# Patient Record
Sex: Female | Born: 1995 | Race: White | Hispanic: No | Marital: Single | Smoking: Never smoker
Health system: Southern US, Community
[De-identification: ages and names within clinical notes are randomized; demographics above are authoritative.]

## PROBLEM LIST (undated history)

## (undated) HISTORY — PX: NO PAST SURGERIES: SHX2092

---
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
## 2013-11-06 ENCOUNTER — Emergency Department (HOSPITAL_COMMUNITY): Payer: Self-pay

## 2013-11-06 ENCOUNTER — Emergency Department (HOSPITAL_COMMUNITY)
Admission: EM | Admit: 2013-11-06 | Discharge: 2013-11-06 | Disposition: A | Discharge status: Home / Self Care | Payer: Self-pay | Attending: Emergency Medicine | Admitting: Emergency Medicine

## 2013-11-06 ENCOUNTER — Encounter (HOSPITAL_COMMUNITY): Payer: Self-pay | Admitting: Emergency Medicine

## 2013-11-06 DIAGNOSIS — R05 Cough: Secondary | ICD-10-CM

## 2013-11-06 DIAGNOSIS — R059 Cough, unspecified: Secondary | ICD-10-CM

## 2013-11-06 DIAGNOSIS — Z880 Allergy status to penicillin: Secondary | ICD-10-CM | POA: Insufficient documentation

## 2013-11-06 DIAGNOSIS — J40 Bronchitis, not specified as acute or chronic: Secondary | ICD-10-CM | POA: Insufficient documentation

## 2013-11-06 MED ORDER — HYDROCODONE-HOMATROPINE 5-1.5 MG/5ML PO SYRP: 5.0000 mL | ORAL_SOLUTION | Freq: Four times a day (QID) | ORAL | Status: DC | PRN

## 2013-11-06 MED ORDER — ALBUTEROL SULFATE HFA 108 (90 BASE) MCG/ACT IN AERS
2.0000 | INHALATION_SPRAY | Freq: Once | RESPIRATORY_TRACT | Status: AC
  Administered 2013-11-06: 2 via RESPIRATORY_TRACT
  Filled 2013-11-06: qty 6.7

## 2013-11-06 MED ORDER — HYDROCODONE-HOMATROPINE 5-1.5 MG/5ML PO SYRP
5.0000 mL | ORAL_SOLUTION | Freq: Once | ORAL | Status: AC
  Administered 2013-11-06: 5 mL via ORAL
  Filled 2013-11-06: qty 5

## 2013-11-06 MED ORDER — PREDNISONE 20 MG PO TABS: 40.0000 mg | ORAL_TABLET | Freq: Every day | ORAL | Status: DC

## 2013-11-06 NOTE — ED Notes (Signed)
Pt complains of a cough for one month and being short of breath, tonight it woke her up and she vomited

## 2013-11-06 NOTE — ED Provider Notes (Signed)
CSN: [id number]     Arrival date & time 11/06/13  0027 History   First MD Initiated Contact with Patient 11/06/13 0105     Chief Complaint  Patient presents with  . Cough    (Consider location/radiation/quality/duration/timing/severity/associated sxs/prior Treatment) Patient is a 18 y.o. female presenting with cough. The history is provided by the patient. No language interpreter was used.  Cough Cough characteristics:  Dry (intermittently productive of clear phlegm, occassional  streaks of bright red blood) Severity:  Moderate Onset quality:  Gradual Duration:  6 weeks Timing:  Intermittent Progression:  Waxing and waning Chronicity:  New Smoker: no   Context: not sick contacts and not upper respiratory infection   Relieved by:  Nothing Worsened by:  Deep breathing Ineffective treatments: OTC remedies. Associated symptoms: sore throat   Associated symptoms: no chest pain, no fever, no rash, no rhinorrhea, no shortness of breath, no sinus congestion and no wheezing   Risk factors: no recent infection and no recent travel     History reviewed. No pertinent past medical history. History reviewed. No pertinent past surgical history. History reviewed. No pertinent family history. History  Substance Use Topics  . Smoking status: Never Smoker   . Smokeless tobacco: Not on file  . Alcohol Use: No   OB History   Grav Para Term Preterm Abortions TAB SAB Ect Mult Living                  Review of Systems  Constitutional: Negative for fever.  HENT: Positive for sore throat. Negative for rhinorrhea.   Respiratory: Positive for cough and chest tightness. Negative for shortness of breath and wheezing.   Cardiovascular: Negative for chest pain.  Gastrointestinal: Negative for nausea and vomiting.  Skin: Negative for rash.  All other systems reviewed and are negative.    Allergies  Penicillins  Home Medications   Prior to Admission medications   Medication Sig Start Date  End Date Taking? Authorizing Provider  etonogestrel (IMPLANON) 68 MG IMPL implant Inject 1 each into the skin once. 08/02/13  Yes Historical Provider, MD  HYDROcodone-homatropine (HYCODAN) 5-1.5 MG/5ML syrup Take 5 mLs by mouth every 6 (six) hours as needed for cough. 11/06/13   Antony MaduraKelly Emiliana Blaize, PA-C  predniSONE (DELTASONE) 20 MG tablet Take 2 tablets (40 mg total) by mouth daily. 11/06/13   Antony MaduraKelly Aura Bibby, PA-C   BP 109/77  Pulse 79  Temp(Src) 98.3 F (36.8 C) (Oral)  Resp 18  Ht 5\' 2"  (1.575 m)  Wt 117 lb (53.071 kg)  BMI 21.39 kg/m2  SpO2 97%   Physical Exam  Nursing note and vitals reviewed. Constitutional: She is oriented to person, place, and time. She appears well-developed and well-nourished. No distress.  Nontoxic/nonseptic appearing.  HENT:  Head: Normocephalic and atraumatic.  Mouth/Throat: Oropharynx is clear and moist. No oropharyngeal exudate.  Eyes: Conjunctivae and EOM are normal. Pupils are equal, round, and reactive to light. No scleral icterus.  Neck: Normal range of motion.  Cardiovascular: Normal rate, regular rhythm and normal heart sounds.   Pulmonary/Chest: Effort normal and breath sounds normal. No respiratory distress. She has no wheezes. She has no rales.  Intermittent dry cough appreciated at bedside. No tachypnea, dyspnea, retractions, or accessory muscle use.  Abdominal: Soft. She exhibits no distension. There is no tenderness. There is no rebound.  Musculoskeletal: Normal range of motion.  Neurological: She is alert and oriented to person, place, and time.  GCS 15. Patient moves extremities without ataxia.  Skin: Skin  is warm and dry. No rash noted. She is not diaphoretic. No erythema. No pallor.  Psychiatric: She has a normal mood and affect. Her behavior is normal.    ED Course  Procedures (including critical care time) Labs Review Labs Reviewed - No data to display  Imaging Review Dg Chest 2 View  11/06/2013   CLINICAL DATA:  Cough.  EXAM: CHEST  2 VIEW   COMPARISON:  None.  FINDINGS: Normal heart size and mediastinal contours. No acute infiltrate or edema. No effusion or pneumothorax. No acute osseous findings.  IMPRESSION: No active cardiopulmonary disease.   Electronically Signed   By: Tiburcio Pea M.D.   On: 11/06/2013 01:25     EKG Interpretation None      MDM   Final diagnoses:  Cough  Bronchitis    18 year old presents for cough x 6 weeks. Symptoms consistent with bronchitis. No evidence of focal consolidation or pneumonia on chest x-ray. Doubt atypically presenting PE as patient without tachycardia, tachypnea, dyspnea, or hypoxia. Patient is well and nontoxic appearing, hemodynamically stable, and afebrile. She is stable for outpatient management with prescriptions for Hycodan, prednisone course, and albuterol inhaler. Primary care followup advised and return precautions discussed. Patient agreeable to plan with no unaddressed concerns.   Filed Vitals:   11/06/13 0034 11/06/13 0148  BP: 109/77 96/56  Pulse: 79 58  Temp: 98.3 F (36.8 C) 98.3 F (36.8 C)  TempSrc: Oral Oral  Resp: 18 16  Height: 5\' 2"  (1.575 m)   Weight: 117 lb (53.071 kg)   SpO2: 97% 96%      Antony Madura, PA-C 11/06/13 [phone]

## 2013-11-06 NOTE — ED Notes (Signed)
Patient transported to X-ray 

## 2013-11-06 NOTE — Discharge Instructions (Signed)
Take prednisone as prescribed. Use albuterol inhaler as needed for shortness of breath, 2 puffs every 4 hours. Use Hycodan as needed for severe cough. Do not drive or drink alcohol after taking this medication as it may make you drowsy. You may use over-the-counter cough remedies as needed. Followup with her primary care doctor.  Cough, Adult  A cough is a reflex that helps clear your throat and airways. It can help heal the body or may be a reaction to an irritated airway. A cough may only last 2 or 3 weeks (acute) or may last more than 8 weeks (chronic).  CAUSES Acute cough:  Viral or bacterial infections. Chronic cough:  Infections.  Allergies.  Asthma.  Post-nasal drip.  Smoking.  Heartburn or acid reflux.  Some medicines.  Chronic lung problems (COPD).  Cancer. SYMPTOMS   Cough.  Fever.  Chest pain.  Increased breathing rate.  High-pitched whistling sound when breathing (wheezing).  Colored mucus that you cough up (sputum). TREATMENT   A bacterial cough may be treated with antibiotic medicine.  A viral cough must run its course and will not respond to antibiotics.  Your caregiver may recommend other treatments if you have a chronic cough. HOME CARE INSTRUCTIONS   Only take over-the-counter or prescription medicines for pain, discomfort, or fever as directed by your caregiver. Use cough suppressants only as directed by your caregiver.  Use a cold steam vaporizer or humidifier in your bedroom or home to help loosen secretions.  Sleep in a semi-upright position if your cough is worse at night.  Rest as needed.  Stop smoking if you smoke. SEEK IMMEDIATE MEDICAL CARE IF:   You have pus in your sputum.  Your cough starts to worsen.  You cannot control your cough with suppressants and are losing sleep.  You begin coughing up blood.  You have difficulty breathing.  You develop pain which is getting worse or is uncontrolled with medicine.  You have  a fever. MAKE SURE YOU:   Understand these instructions.  Will watch your condition.  Will get help right away if you are not doing well or get worse. Document Released: 11/21/2010 Document Revised: 08/17/2011 Document Reviewed: 11/21/2010 [facility] Patient Information 2014 [place], [place].

## 2013-11-08 NOTE — ED Provider Notes (Signed)
I personally performed the services described in this documentation, which was scribed in my presence. The recorded information has been reviewed and is accurate.  Derwood Kaplan, MD 11/08/13 [phone]

## 2014-01-19 ENCOUNTER — Encounter (HOSPITAL_COMMUNITY): Payer: Self-pay | Admitting: Emergency Medicine

## 2014-01-19 ENCOUNTER — Emergency Department (HOSPITAL_COMMUNITY)
Admission: EM | Admit: 2014-01-19 | Discharge: 2014-01-19 | Disposition: A | Discharge status: Home / Self Care | Payer: Self-pay | Attending: Emergency Medicine | Admitting: Emergency Medicine

## 2014-01-19 DIAGNOSIS — L299 Pruritus, unspecified: Secondary | ICD-10-CM | POA: Insufficient documentation

## 2014-01-19 DIAGNOSIS — Z880 Allergy status to penicillin: Secondary | ICD-10-CM | POA: Insufficient documentation

## 2014-01-19 DIAGNOSIS — R21 Rash and other nonspecific skin eruption: Secondary | ICD-10-CM | POA: Insufficient documentation

## 2014-01-19 MED ORDER — DIPHENHYDRAMINE HCL 25 MG PO TABS: 25.0000 mg | ORAL_TABLET | Freq: Every evening | ORAL | Status: DC | PRN

## 2014-01-19 MED ORDER — LORATADINE 10 MG PO TABS: 10.0000 mg | ORAL_TABLET | Freq: Every day | ORAL | Status: DC

## 2014-01-19 MED ORDER — HYDROCORTISONE 1 % EX CREA: 1.0000 "application " | TOPICAL_CREAM | Freq: Two times a day (BID) | CUTANEOUS | Status: DC

## 2014-01-19 MED ORDER — PERMETHRIN 5 % EX CREA: TOPICAL_CREAM | CUTANEOUS | Status: DC

## 2014-01-19 NOTE — ED Notes (Signed)
Pt states she has a rash on the back of her neck that radiates up into her hair line. Pt states rash started 3 weeks ago. Now rash is on upper arms. Pt states rash is very itchy. Pt has used benadryl, but stopped using it because it makes her very sleepy. Pt has no acute distress.

## 2014-01-19 NOTE — ED Provider Notes (Signed)
CSN: [id number]     Arrival date & time 01/19/14  1701 History   First MD Initiated Contact with Patient 01/19/14 1726     Chief Complaint  Patient presents with  . Rash     (Consider location/radiation/quality/duration/timing/severity/associated sxs/prior Treatment) HPI Comments: Patient a 18 year old female presenting to the emergency department for 3 weeks. Neck rash he can that extended into her scalp and onto her right arm. Patient states she had a similar rash in the past. Denies any new exposures or insect bites. Patient states she has tried Benadryl, but makes her too sleepy so she stopped taking it. Denies any fevers, chills, nausea, vomiting, abdominal pain, SOB, lip or tongue swelling.   Patient is a 18 y.o. female presenting with rash.  Rash   History reviewed. No pertinent past medical history. History reviewed. No pertinent past surgical history. No family history on file. History  Substance Use Topics  . Smoking status: Never Smoker   . Smokeless tobacco: Not on file  . Alcohol Use: No   OB History   Grav Para Term Preterm Abortions TAB SAB Ect Mult Living                 Review of Systems  Skin: Positive for rash.  All other systems reviewed and are negative.     Allergies  Penicillins  Home Medications   Prior to Admission medications   Medication Sig Start Date End Date Taking? Authorizing Provider  diphenhydrAMINE (BENADRYL) 25 MG tablet Take 1 tablet (25 mg total) by mouth at bedtime as needed for itching (Rash). 01/19/14   Jarrius Huaracha L Caitlynn Ju, PA-C  etonogestrel (IMPLANON) 68 MG IMPL implant Inject 1 each into the skin once. 08/02/13   Historical Provider, MD  HYDROcodone-homatropine (HYCODAN) 5-1.5 MG/5ML syrup Take 5 mLs by mouth every 6 (six) hours as needed for cough. 11/06/13   Antony MaduraKelly Humes, PA-C  hydrocortisone cream 1 % Apply 1 application topically 2 (two) times daily. 01/19/14   Ashara Lounsbury L Jaqwan Wieber, PA-C  loratadine (CLARITIN) 10 MG tablet  Take 1 tablet (10 mg total) by mouth daily. 01/19/14   Donnavan Covault L Seanna Sisler, PA-C  permethrin (ELIMITE) 5 % cream Apply to affected area once 01/19/14   Victorino DikeJennifer L Nayleen Janosik, PA-C  predniSONE (DELTASONE) 20 MG tablet Take 2 tablets (40 mg total) by mouth daily. 11/06/13   Antony MaduraKelly Humes, PA-C   BP 107/63  Pulse 65  Temp(Src) 98.4 F (36.9 C) (Oral)  Resp 16  SpO2 98% Physical Exam  Nursing note and vitals reviewed. Constitutional: She is oriented to person, place, and time. She appears well-developed and well-nourished. No distress.  HENT:  Head: Normocephalic and atraumatic.  Right Ear: External ear normal.  Left Ear: External ear normal.  Nose: Nose normal.  Mouth/Throat: Oropharynx is clear and moist.  Eyes: Conjunctivae are normal.  Neck: Normal range of motion. Neck supple.  Cardiovascular: Normal rate.   Pulmonary/Chest: Effort normal.  Abdominal: Soft.  Musculoskeletal: Normal range of motion.  Neurological: She is alert and oriented to person, place, and time.  Skin: Skin is warm and dry. Rash noted. Rash is maculopapular (posterior neck and scalp. non-TTP. no drainage). She is not diaphoretic.  Psychiatric: She has a normal mood and affect.    ED Course  Procedures (including critical care time) Labs Review Labs Reviewed - No data to display  Imaging Review No results found.   EKG Interpretation None      MDM   Final diagnoses:  Rash and  nonspecific skin eruption    Filed Vitals:   01/19/14 1736  BP: 107/63  Pulse: 65  Temp: 98.4 F (36.9 C)  Resp: 16   Afebrile, NAD, non-toxic appearing, AAOx4.  No evidence of SJS or necrotizing fasciitis. Due to pruritic and not painful nature of blisters do not suspect pemphigus vulgaris. Pustules do not resemble scabies as per pt hx or allergic reaction. No blisters, no pustules, no warmth, no draining sinus tracts, no superficial abscesses, no bullous impetigo, no vesicles, no desquamation, no target lesions with  dusky purpura or a central bulla. Not tender to touch. Will discharge with symptomatic measures. Return precautions discussed. Patient is agreeable to plan. Patient is stable at time of discharge       Jeannetta Ellis, PA-C 01/19/14 2018

## 2014-01-19 NOTE — Discharge Instructions (Signed)
Please follow up with your primary care physician in 1-2 days. If you do not have one [facility] listed above. Please use medications as prescribed. Please do not take Claritin and Benadryl together. Please read all discharge instructions and return precautions.    Rash A rash is a change in the color or texture of your skin. There are many different types of rashes. You may have other problems that accompany your rash. CAUSES   Infections.  Allergic reactions. This can include allergies to pets or foods.  Certain medicines.  Exposure to certain chemicals, soaps, or cosmetics.  Heat.  Exposure to poisonous plants.  Tumors, both cancerous and noncancerous. SYMPTOMS   Redness.  Scaly skin.  Itchy skin.  Dry or cracked skin.  Bumps.  Blisters.  Pain. DIAGNOSIS  Your caregiver may do a physical exam to determine what type of rash you have. A skin sample (biopsy) may be taken and examined under a microscope. TREATMENT  Treatment depends on the type of rash you have. Your caregiver may prescribe certain medicines. For serious conditions, you may need to see a skin doctor (dermatologist). HOME CARE INSTRUCTIONS   Avoid the substance that caused your rash.  Do not scratch your rash. This can cause infection.  You may take cool baths to help stop itching.  Only take over-the-counter or prescription medicines as directed by your caregiver.  Keep all follow-up appointments as directed by your caregiver. SEEK IMMEDIATE MEDICAL CARE IF:  You have increasing pain, swelling, or redness.  You have a fever.  You have new or severe symptoms.  You have body aches, diarrhea, or vomiting.  Your rash is not better after 3 days. MAKE SURE YOU:  Understand these instructions.  Will watch your condition.  Will get help right away if you are not doing well or get worse. Document Released: 05/15/2002 Document Revised: 08/17/2011  Document Reviewed: 03/09/2011 [facility] Patient Information 2015 [place]. This information is not intended to replace advice given to you by your health care provider. Make sure you discuss any questions you have with your health care provider.

## 2014-01-20 NOTE — ED Provider Notes (Signed)
Medical screening examination/treatment/procedure(s) were performed by non-physician practitioner and as supervising physician I was immediately available for consultation/collaboration.   EKG Interpretation None        Mirian MoMatthew Gentry, MD 01/20/14 1627

## 2014-01-30 ENCOUNTER — Emergency Department (HOSPITAL_COMMUNITY): Payer: Self-pay

## 2014-01-30 ENCOUNTER — Encounter (HOSPITAL_COMMUNITY): Payer: Self-pay | Admitting: Emergency Medicine

## 2014-01-30 ENCOUNTER — Emergency Department (HOSPITAL_COMMUNITY)
Admission: EM | Admit: 2014-01-30 | Discharge: 2014-01-30 | Disposition: A | Discharge status: Home / Self Care | Payer: Self-pay | Attending: Emergency Medicine | Admitting: Emergency Medicine

## 2014-01-30 DIAGNOSIS — B9789 Other viral agents as the cause of diseases classified elsewhere: Secondary | ICD-10-CM

## 2014-01-30 DIAGNOSIS — J069 Acute upper respiratory infection, unspecified: Secondary | ICD-10-CM | POA: Insufficient documentation

## 2014-01-30 DIAGNOSIS — IMO0002 Reserved for concepts with insufficient information to code with codable children: Secondary | ICD-10-CM | POA: Insufficient documentation

## 2014-01-30 DIAGNOSIS — R05 Cough: Secondary | ICD-10-CM | POA: Insufficient documentation

## 2014-01-30 DIAGNOSIS — Z79899 Other long term (current) drug therapy: Secondary | ICD-10-CM | POA: Insufficient documentation

## 2014-01-30 DIAGNOSIS — R059 Cough, unspecified: Secondary | ICD-10-CM | POA: Insufficient documentation

## 2014-01-30 DIAGNOSIS — Z880 Allergy status to penicillin: Secondary | ICD-10-CM | POA: Insufficient documentation

## 2014-01-30 MED ORDER — GUAIFENESIN ER 1200 MG PO TB12: 1.0000 | ORAL_TABLET | Freq: Two times a day (BID) | ORAL | Status: DC

## 2014-01-30 MED ORDER — ALBUTEROL SULFATE HFA 108 (90 BASE) MCG/ACT IN AERS
2.0000 | INHALATION_SPRAY | RESPIRATORY_TRACT | Status: DC | PRN
  Administered 2014-01-30: 2 via RESPIRATORY_TRACT
  Filled 2014-01-30: qty 6.7

## 2014-01-30 MED ORDER — PREDNISONE 50 MG PO TABS
50.0000 mg | ORAL_TABLET | Freq: Every day | ORAL | Status: DC
Start: 2014-01-30 — End: 2014-03-01

## 2014-01-30 MED ORDER — ACETAMINOPHEN-CODEINE 120-12 MG/5ML PO SOLN: 10.0000 mL | ORAL | Status: DC | PRN

## 2014-01-30 NOTE — ED Provider Notes (Signed)
CSN: [id number]     Arrival date & time 01/30/14  1850 History   None    This chart was scribed for non-physician practitioner, Ebbie Ridge PA-C working with Mirian Mo, MD by Arlan Organ, ED Scribe. This patient was seen in room WTR5/WTR5 and the patient's care was started at 7:30 PM.    Chief Complaint  Patient presents with  . Cough  . Bronchitis  . Sore Throat   The history is provided by the patient. No language interpreter was used.    HPI Comments: Krista Bartlett is a 18 y.o. female who presents to the Emergency Department complaining of possible bronchitis. She reports cough, SOB, chest tightness, fatigue, and sore throat onset 7 days. Pt was seen in June 2015 for same and was treated with  antibiotics, codeine, and inhaler. Pt states she recovered without difficulty. However, symptoms have returned. She denies any fever, chills, otalgia, wheezing, or diaphoresis. She admits to a known contact as her boyfriend was recently diagnosed with bronchitis. Pt with known allergy to Penicillin. No other concerns this visit.  History reviewed. No pertinent past medical history. History reviewed. No pertinent past surgical history. No family history on file. History  Substance Use Topics  . Smoking status: Never Smoker   . Smokeless tobacco: Not on file  . Alcohol Use: Yes     Comment: every other day   OB History   Grav Para Term Preterm Abortions TAB SAB Ect Mult Living                 Review of Systems  Constitutional: Positive for fatigue. Negative for fever and chills.  HENT: Positive for sore throat.   Respiratory: Positive for cough and shortness of breath.       Allergies  Penicillins  Home Medications   Prior to Admission medications   Medication Sig Start Date End Date Taking? Authorizing Provider  diphenhydrAMINE (BENADRYL) 25 MG tablet Take 1 tablet (25 mg total) by mouth at bedtime as needed for itching (Rash). 01/19/14   Jennifer L Piepenbrink, PA-C   etonogestrel (IMPLANON) 68 MG IMPL implant Inject 1 each into the skin once. 08/02/13   Historical Provider, MD  HYDROcodone-homatropine (HYCODAN) 5-1.5 MG/5ML syrup Take 5 mLs by mouth every 6 (six) hours as needed for cough. 11/06/13   Antony Madura, PA-C  hydrocortisone cream 1 % Apply 1 application topically 2 (two) times daily. 01/19/14   Jennifer L Piepenbrink, PA-C  loratadine (CLARITIN) 10 MG tablet Take 1 tablet (10 mg total) by mouth daily. 01/19/14   Jennifer L Piepenbrink, PA-C  permethrin (ELIMITE) 5 % cream Apply to affected area once 01/19/14   Victorino Dike L Piepenbrink, PA-C  predniSONE (DELTASONE) 20 MG tablet Take 2 tablets (40 mg total) by mouth daily. 11/06/13   Antony Madura, PA-C   Triage Vitals: BP 118/55  Pulse 83  Temp(Src) 98.1 F (36.7 C) (Oral)  Resp 17  SpO2 98%   Physical Exam  Nursing note and vitals reviewed. Constitutional: She is oriented to person, place, and time. She appears well-developed and well-nourished.  HENT:  Head: Normocephalic and atraumatic.  Mouth/Throat: Oropharynx is clear and moist.  Eyes: EOM are normal. Pupils are equal, round, and reactive to light.  Neck: Normal range of motion. Neck supple.  Cardiovascular: Normal rate and regular rhythm.  Exam reveals no gallop and no friction rub.   No murmur heard. Pulmonary/Chest: Effort normal and breath sounds normal. No respiratory distress. She has no wheezes.  Abdominal:  She exhibits no distension.  Musculoskeletal: Normal range of motion.  Neurological: She is alert and oriented to person, place, and time.  Skin: Skin is warm and dry. No rash noted. No erythema.  Psychiatric: She has a normal mood and affect.    ED Course  Procedures (including critical care time)  DIAGNOSTIC STUDIES: Oxygen Saturation is 98% on RA, Normal by my interpretation.    COORDINATION OF CARE: 7:10 PM-Discussed treatment plan with pt at bedside and pt agreed to plan.     Patient will be treated for URI and and  advised to follow up with a PCP or [facility]   I personally performed the services described in this documentation, which was scribed in my presence. The recorded information has been reviewed and is accurate.    Carlyle Dolly, PA-C 02/06/14 [phone]

## 2014-01-30 NOTE — Discharge Instructions (Signed)
Return here as needed. Follow up with an urgent or PCP.INcrease your fluid intake and rest as much as possible.

## 2014-01-30 NOTE — ED Notes (Signed)
Pt states that she is here today due to bronchitis for 7 days. pt states that she had it for several months back. Pt c/o cough time one wekk and past couple of days has blood tinge in sputum.  Pt states she also woke up today with body aches.

## 2014-02-09 NOTE — ED Provider Notes (Signed)
Medical screening examination/treatment/procedure(s) were performed by non-physician practitioner and as supervising physician I was immediately available for consultation/collaboration.   EKG Interpretation None        Margaretmary Prisk, MD 02/09/14 0720 

## 2014-03-01 ENCOUNTER — Encounter (HOSPITAL_COMMUNITY): Payer: Self-pay | Admitting: Emergency Medicine

## 2014-03-01 ENCOUNTER — Emergency Department (HOSPITAL_COMMUNITY)
Admission: EM | Admit: 2014-03-01 | Discharge: 2014-03-01 | Disposition: A | Discharge status: Home / Self Care | Payer: Self-pay | Attending: Emergency Medicine | Admitting: Emergency Medicine

## 2014-03-01 DIAGNOSIS — R21 Rash and other nonspecific skin eruption: Secondary | ICD-10-CM | POA: Insufficient documentation

## 2014-03-01 DIAGNOSIS — Z880 Allergy status to penicillin: Secondary | ICD-10-CM | POA: Insufficient documentation

## 2014-03-01 DIAGNOSIS — L259 Unspecified contact dermatitis, unspecified cause: Secondary | ICD-10-CM | POA: Insufficient documentation

## 2014-03-01 DIAGNOSIS — Z79899 Other long term (current) drug therapy: Secondary | ICD-10-CM | POA: Insufficient documentation

## 2014-03-01 MED ORDER — CLOBETASOL PROPIONATE 0.05 % EX SHAM: 1.0000 "application " | MEDICATED_SHAMPOO | Freq: Every day | CUTANEOUS | Status: DC

## 2014-03-01 NOTE — ED Notes (Signed)
Pt c/o itchy, painful rash to back of neck x 1 month.  Has not seen anyone for it.

## 2014-03-01 NOTE — Discharge Instructions (Signed)
1. Medications: clobex shampoo, usual home medications 2. Treatment: rest, drink plenty of fluids,  3. Follow Up: Please followup with dermatology   Contact Dermatitis Contact dermatitis is a reaction to certain substances that touch the skin. Contact dermatitis can be either irritant contact dermatitis or allergic contact dermatitis. Irritant contact dermatitis does not require previous exposure to the substance for a reaction to occur.Allergic contact dermatitis only occurs if you have been exposed to the substance before. Upon a repeat exposure, your body reacts to the substance.  CAUSES  Many substances can cause contact dermatitis. Irritant dermatitis is most commonly caused by repeated exposure to mildly irritating substances, such as:  Makeup.  Soaps.  Detergents.  Bleaches.  Acids.  Metal salts, such as nickel. Allergic contact dermatitis is most commonly caused by exposure to:  Poisonous plants.  Chemicals (deodorants, shampoos).  Jewelry.  Latex.  Neomycin in triple antibiotic cream.  Preservatives in products, including clothing. SYMPTOMS  The area of skin that is exposed may develop:  Dryness or flaking.  Redness.  Cracks.  Itching.  Pain or a burning sensation.  Blisters. With allergic contact dermatitis, there may also be swelling in areas such as the eyelids, mouth, or genitals.  DIAGNOSIS  Your caregiver can usually tell what the problem is by doing a physical exam. In cases where the cause is uncertain and an allergic contact dermatitis is suspected, a patch skin test may be performed to help determine the cause of your dermatitis. TREATMENT Treatment includes protecting the skin from further contact with the irritating substance by avoiding that substance if possible. Barrier creams, powders, and gloves may be helpful. Your caregiver may also recommend:  Steroid creams or ointments applied 2 times daily. For best results, soak the rash area in  cool water for 20 minutes. Then apply the medicine. Cover the area with a plastic wrap. You can store the steroid cream in the refrigerator for a "chilly" effect on your rash. That may decrease itching. Oral steroid medicines may be needed in more severe cases.  Antibiotics or antibacterial ointments if a skin infection is present.  Antihistamine lotion or an antihistamine taken by mouth to ease itching.  Lubricants to keep moisture in your skin.  Burow's solution to reduce redness and soreness or to dry a weeping rash. Mix one packet or tablet of solution in 2 cups cool water. Dip a clean washcloth in the mixture, wring it out a bit, and put it on the affected area. Leave the cloth in place for 30 minutes. Do this as often as possible throughout the day.  Taking several cornstarch or baking soda baths daily if the area is too large to cover with a washcloth. Harsh chemicals, such as alkalis or acids, can cause skin damage that is like a burn. You should flush your skin for 15 to 20 minutes with cold water after such an exposure. You should also seek immediate medical care after exposure. Bandages (dressings), antibiotics, and pain medicine may be needed for severely irritated skin.  HOME CARE INSTRUCTIONS  Avoid the substance that caused your reaction.  Keep the area of skin that is affected away from hot water, soap, sunlight, chemicals, acidic substances, or anything else that would irritate your skin.  Do not scratch the rash. Scratching may cause the rash to become infected.  You may take cool baths to help stop the itching.  Only take over-the-counter or prescription medicines as directed by your caregiver.  See your caregiver for follow-up  care as directed to make sure your skin is healing properly. SEEK MEDICAL CARE IF:   Your condition is not better after 3 days of treatment.  You seem to be getting worse.  You see signs of infection such as swelling, tenderness, redness,  soreness, or warmth in the affected area.  You have any problems related to your medicines. Document Released: 05/22/2000 Document Revised: 08/17/2011 Document Reviewed: 10/28/2010 [facility] Patient Information 2015 [place], [place]. This information is not intended to replace advice given to you by your health care provider. Make sure you discuss any questions you have with your health care provider.

## 2014-03-01 NOTE — ED Provider Notes (Signed)
CSN: [id number]     Arrival date & time 03/01/14  1744 History   First MD Initiated Contact with Patient 03/01/14 1835     Chief Complaint  Patient presents with  . Rash     (Consider location/radiation/quality/duration/timing/severity/associated sxs/prior Treatment) Patient is a 18 y.o. female presenting with rash. The history is provided by the patient and medical records. No language interpreter was used.  Rash Associated symptoms: no abdominal pain, no diarrhea, no fatigue, no fever, no headaches, no nausea, no shortness of breath, not vomiting and not wheezing     Ondrea Dow is a 18 y.o. female  with no major medical problems presents to the Emergency Department complaining of gradual, persistent, progressively worsening rash onset 1 month without previous treatment. Pt reports she had the rash last month which developed right after she used a T-gel shampoo and was told that the initial rash was from that.  She reports she then used an "off brand" head and shoulders and the rash returned.  Pt reports the rash is on the back of her neck and scalp.  She reports taking Claritin and applying a solution given by the ER last night.  Neither are helping.  Pt reports hot showers makes the rash better, but nothing makes it worse.  Pt was using the head and shoulders for dandruff.    History reviewed. No pertinent past medical history. No past surgical history on file. No family history on file. History  Substance Use Topics  . Smoking status: Never Smoker   . Smokeless tobacco: Not on file  . Alcohol Use: Yes     Comment: every other day   OB History   Grav Para Term Preterm Abortions TAB SAB Ect Mult Living                 Review of Systems  Constitutional: Negative for fever, diaphoresis, appetite change, fatigue and unexpected weight change.  HENT: Negative for mouth sores.   Eyes: Negative for visual disturbance.  Respiratory: Negative for cough, chest tightness, shortness of  breath and wheezing.   Cardiovascular: Negative for chest pain.  Gastrointestinal: Negative for nausea, vomiting, abdominal pain, diarrhea and constipation.  Endocrine: Negative for polydipsia, polyphagia and polyuria.  Genitourinary: Negative for dysuria, urgency, frequency and hematuria.  Musculoskeletal: Negative for back pain and neck stiffness.  Skin: Positive for rash.  Allergic/Immunologic: Negative for immunocompromised state.  Neurological: Negative for syncope, light-headedness and headaches.  Hematological: Does not bruise/bleed easily.  Psychiatric/Behavioral: Negative for sleep disturbance. The patient is not nervous/anxious.       Allergies  Penicillins  Home Medications   Prior to Admission medications   Medication Sig Start Date End Date Taking? Authorizing Provider  etonogestrel (IMPLANON) 68 MG IMPL implant Inject 1 each into the skin once. 08/02/13  Yes Historical Provider, MD  PRESCRIPTION MEDICATION For yeast infection.  For 7 days.   Yes Historical Provider, MD  Clobetasol Propionate 0.05 % shampoo Apply 1 application topically daily. 03/01/14   Tamiya Colello, PA-C   BP 102/59  Pulse 67  Temp(Src) 98.2 F (36.8 C) (Oral)  Resp 16  SpO2 100% Physical Exam  Nursing note and vitals reviewed. Constitutional: She appears well-developed and well-nourished. No distress.  Awake, alert, nontoxic appearance  HENT:  Head: Normocephalic and atraumatic.  Mouth/Throat: Oropharynx is clear and moist. No oropharyngeal exudate.  Eyes: Conjunctivae are normal. No scleral icterus.  Neck: Normal range of motion. Neck supple.  Cardiovascular: Normal rate, regular rhythm, normal  heart sounds and intact distal pulses.   No murmur heard. Pulmonary/Chest: Effort normal and breath sounds normal. No respiratory distress. She has no wheezes.  Equal chest expansion  Abdominal: Soft. Bowel sounds are normal. She exhibits no mass. There is no tenderness. There is no rebound  and no guarding.  Musculoskeletal: Normal range of motion. She exhibits no edema.  Neurological: She is alert.  Speech is clear and goal oriented Moves extremities without ataxia  Skin: Skin is warm and dry. Rash noted. She is not diaphoretic.  Papular rash scattered over the posterior neck and the entirety of the scalp excoriation throughout no scaling of the skin, induration or evidence of secondary cellulitis or abscess; no pustules or vesicles  Psychiatric: She has a normal mood and affect.    ED Course  Procedures (including critical care time) Labs Review Labs Reviewed - No data to display  Imaging Review No results found.   EKG Interpretation None      MDM   Final diagnoses:  Contact dermatitis   Suni Jarnagin presents with rash to the scalp and posterior neck where her hair lies.  Patient has used several different kinds of shampoo in the last few weeks to help control dandruff causing a waxing and waning of the rash. She reports using an unknown cream without relief.  She is currently using Pantene, but the rash has not improved.    Rash likely contact in nature due to the changing of her shampoo. No evidence of abscess or secondary cellulitis. Patient will be discharged home with steroid shampoo and instructions for close followup with dermatology.  BP 102/59  Pulse 67  Temp(Src) 98.2 F (36.8 C) (Oral)  Resp 16  SpO2 100%   Dierdre Forth, PA-C 03/01/14 2010

## 2014-03-06 NOTE — ED Provider Notes (Signed)
Medical screening examination/treatment/procedure(s) were performed by non-physician practitioner and as supervising physician I was immediately available for consultation/collaboration.   EKG Interpretation None        Arn Mcomber T Corsica Franson, MD 03/06/14 2327 

## 2014-07-31 ENCOUNTER — Emergency Department (HOSPITAL_COMMUNITY): Payer: Self-pay

## 2014-07-31 ENCOUNTER — Encounter (HOSPITAL_COMMUNITY): Payer: Self-pay

## 2014-07-31 ENCOUNTER — Emergency Department (HOSPITAL_COMMUNITY)
Admission: EM | Admit: 2014-07-31 | Discharge: 2014-07-31 | Disposition: A | Discharge status: Home / Self Care | Payer: Self-pay | Attending: Emergency Medicine | Admitting: Emergency Medicine

## 2014-07-31 DIAGNOSIS — Z880 Allergy status to penicillin: Secondary | ICD-10-CM | POA: Insufficient documentation

## 2014-07-31 DIAGNOSIS — R079 Chest pain, unspecified: Secondary | ICD-10-CM | POA: Insufficient documentation

## 2014-07-31 DIAGNOSIS — J40 Bronchitis, not specified as acute or chronic: Secondary | ICD-10-CM | POA: Insufficient documentation

## 2014-07-31 DIAGNOSIS — Z79899 Other long term (current) drug therapy: Secondary | ICD-10-CM | POA: Insufficient documentation

## 2014-07-31 DIAGNOSIS — Z793 Long term (current) use of hormonal contraceptives: Secondary | ICD-10-CM | POA: Insufficient documentation

## 2014-07-31 MED ORDER — DEXTROMETHORPHAN POLISTIREX 30 MG/5ML PO LQCR: 30.0000 mg | ORAL | Status: DC | PRN

## 2014-07-31 MED ORDER — ALBUTEROL SULFATE HFA 108 (90 BASE) MCG/ACT IN AERS: 2.0000 | INHALATION_SPRAY | RESPIRATORY_TRACT | Status: AC | PRN

## 2014-07-31 MED ORDER — BENZONATATE 100 MG PO CAPS: 100.0000 mg | ORAL_CAPSULE | Freq: Three times a day (TID) | ORAL | Status: DC

## 2014-07-31 NOTE — Discharge Instructions (Signed)
You were evaluated in the ED today for your cough. Your likely suffering from acute bronchitis. Your chest x-ray showed that there was no evidence of pneumonia. He will be discharged with an albuterol inhaler and a cough suppressant. Please take his medications as directed to help with her symptoms. Please follow-up with your primary care, [facility] for further evaluation management of your symptoms. Return to ED for new or worsening symptoms.

## 2014-07-31 NOTE — ED Notes (Signed)
Pt c/o productive cough x 2 weeks.  Sts chest hurts when she coughs.  Pain score 4/10.  Pt has not taken anything for pain.  Sts "I know it's bronchitis.  I lost the inhaler you guys gave me last time."

## 2014-07-31 NOTE — ED Provider Notes (Signed)
CSN: [id number]     Arrival date & time 07/31/14  1532 History  This chart was scribed for non-physician practitioner, Joycie Peek, PA-C, working with Mirian Mo, MD by Charline Bills, ED Scribe. This patient was seen in room WTR8/WTR8 and the patient's care was started at 4:16 PM.   Chief Complaint  Patient presents with  . Cough   The history is provided by the patient. No language interpreter was used.   HPI Comments: Krista Bartlett is a 19 y.o. female, with no pertinent medical history, who presents to the Emergency Department complaining of persistent dry cough for the past 2 weeks. Pt reports associated rhinorrhea and chest pain only with coughing, worse at night. She denies fever, chills, hemoptysis, SOB, abdominal pain, nausea, vomiting. Pt reports similar symptoms every 3 months that she attributes to nightly THC use and a wood stove. No treatments tried PTA. No h/o asthma. No other modifying factors  History reviewed. No pertinent past medical history. History reviewed. No pertinent past surgical history. History reviewed. No pertinent family history. History  Substance Use Topics  . Smoking status: Never Smoker   . Smokeless tobacco: Not on file  . Alcohol Use: Yes     Comment: every other day   OB History    No data available     Review of Systems  Constitutional: Negative for fever and chills.  HENT: Positive for rhinorrhea.   Respiratory: Positive for cough. Negative for shortness of breath.   Cardiovascular: Positive for chest pain (only with coughing).  Gastrointestinal: Negative for nausea, vomiting and abdominal pain.   Allergies  Penicillins  Home Medications   Prior to Admission medications   Medication Sig Start Date End Date Taking? Authorizing Provider  albuterol (PROVENTIL HFA;VENTOLIN HFA) 108 (90 BASE) MCG/ACT inhaler Inhale 2 puffs into the lungs every 2 (two) hours as needed for wheezing or shortness of breath (cough). 07/31/14   Earle Gell  Kerwin Augustus, PA-C  benzonatate (TESSALON) 100 MG capsule Take 1 capsule (100 mg total) by mouth every 8 (eight) hours. 07/31/14   Earle Gell Chayne Baumgart, PA-C  Clobetasol Propionate 0.05 % shampoo Apply 1 application topically daily. 03/01/14   Hannah Muthersbaugh, PA-C  dextromethorphan (DELSYM) 30 MG/5ML liquid Take 5 mLs (30 mg total) by mouth as needed for cough. 07/31/14   Earle Gell Rhyder Koegel, PA-C  etonogestrel (IMPLANON) 68 MG IMPL implant Inject 1 each into the skin once. 08/02/13   Historical Provider, MD  PRESCRIPTION MEDICATION For yeast infection.  For 7 days.    Historical Provider, MD   BP 115/58 mmHg  Pulse 79  Temp(Src) 98.3 F (36.8 C) (Oral)  Resp 16  SpO2 100%  LMP 07/31/2014 Physical Exam  Constitutional: She is oriented to person, place, and time. She appears well-developed and well-nourished. No distress.  HENT:  Head: Normocephalic and atraumatic.  Eyes: Conjunctivae and EOM are normal.  Neck: Neck supple. No tracheal deviation present.  Cardiovascular: Normal rate, regular rhythm and normal heart sounds.   Pulmonary/Chest: Effort normal and breath sounds normal. No respiratory distress. She has no wheezes. She has no rhonchi. She has no rales.  Lungs are clear to auscultation bilaterally.   Musculoskeletal: Normal range of motion.  Neurological: She is alert and oriented to person, place, and time.  Skin: Skin is warm and dry.  Psychiatric: She has a normal mood and affect. Her behavior is normal.  Nursing note and vitals reviewed.  ED Course  Procedures (including critical care time) DIAGNOSTIC STUDIES: Oxygen  Saturation is 100% on RA, normal by my interpretation.    COORDINATION OF CARE: 4:20 PM-Discussed treatment plan which includes CXR, albuterol inhaler, Delsym and return precautions with pt at bedside and pt agreed to plan.   Labs Review Labs Reviewed - No data to display  Imaging Review Dg Chest 2 View (if Patient Has Fever And/or Copd)  07/31/2014    CLINICAL DATA:  Two week history of nonradiating midline chest pain. Difficulty breathing and cough  EXAM: CHEST  2 VIEW  COMPARISON:  January 30, 2014  FINDINGS: Lungs are clear. Heart size and pulmonary vascularity are normal. No adenopathy. No pneumothorax. No bone lesions.  IMPRESSION: No edema or consolidation.   Electronically Signed   By: Bretta BangWilliam  Woodruff III M.D.   On: 07/31/2014 16:18    EKG Interpretation None     Meds given in ED:  Medications - No data to display  Discharge Medication List as of 07/31/2014  4:38 PM    START taking these medications   Details  albuterol (PROVENTIL HFA;VENTOLIN HFA) 108 (90 BASE) MCG/ACT inhaler Inhale 2 puffs into the lungs every 2 (two) hours as needed for wheezing or shortness of breath (cough)., Starting 07/31/2014, Until Discontinued, Print    dextromethorphan (DELSYM) 30 MG/5ML liquid Take 5 mLs (30 mg total) by mouth as needed for cough., Starting 07/31/2014, Until Discontinued, Print       Filed Vitals:   07/31/14 1546  BP: 115/58  Pulse: 79  Temp: 98.3 F (36.8 C)  TempSrc: Oral  Resp: 16  SpO2: 100%    MDM  Vitals stable - WNL -afebrile Pt resting comfortably in ED. PE--benign lung exam  Imaging--chest x-ray shows no acute cardio pulmonary pathology.  DDX--patient likely suffering from acute bronchitis. Will DC with bronchodilator, cough suppressant.  I discussed all relevant lab findings and imaging results with pt and they verbalized understanding. Discussed f/u with PCP within 48 hrs and return precautions, pt very amenable to plan.   Final diagnoses:  Bronchitis    I personally performed the services described in this documentation, which was scribed in my presence. The recorded information has been reviewed and is accurate.     Earle GellBenjamin W Schroon Lakeartner, PA-C 07/31/14 2050  Mirian MoMatthew Gentry, MD 08/02/14 1100

## 2015-08-18 ENCOUNTER — Ambulatory Visit (INDEPENDENT_AMBULATORY_CARE_PROVIDER_SITE_OTHER): Payer: Self-pay | Admitting: Family Medicine

## 2015-08-18 VITALS — BP 110/70 | HR 75 | Temp 98.7°F | Resp 16 | Ht 62.0 in | Wt 109.0 lb

## 2015-08-18 DIAGNOSIS — M545 Low back pain, unspecified: Secondary | ICD-10-CM

## 2015-08-18 DIAGNOSIS — M546 Pain in thoracic spine: Secondary | ICD-10-CM

## 2015-08-18 LAB — POCT URINALYSIS DIP (MANUAL ENTRY)
Bilirubin, UA: NEGATIVE
Blood, UA: NEGATIVE
Glucose, UA: NEGATIVE
Leukocytes, UA: NEGATIVE
Nitrite, UA: NEGATIVE
Protein Ur, POC: NEGATIVE
Spec Grav, UA: 1.02
Urobilinogen, UA: 0.2
pH, UA: 6

## 2015-08-18 LAB — POC MICROSCOPIC URINALYSIS (UMFC): Mucus: ABSENT

## 2015-08-18 MED ORDER — MELOXICAM 7.5 MG PO TABS: 7.5000 mg | ORAL_TABLET | Freq: Every day | ORAL | Status: DC

## 2015-08-18 NOTE — Patient Instructions (Addendum)
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 Probably a good idea to find a different job in the meantime I ordered some meloxicam to help him stop inflammation. Massage and heat will also help.

## 2015-08-18 NOTE — Progress Notes (Signed)
Subjective:    Patient ID: Krista Bartlett, female    DOB: 09-14-1995, 20 y.o.   MRN: [id number] By signing my name below, I, Littie Deeds, attest that this documentation has been prepared under the direction and in the presence of Elvina Sidle, MD.  Electronically Signed: Littie Deeds, Medical Scribe. 08/18/2015. 12:46 PM.  HPI HPI Comments: Krista Bartlett is a 20 y.o. female who presents to the [facility] complaining of gradual onset lower back pain that started about a month ago. The pain is worse when lying down. She notes that she had numbness in her thighs the other day while she was working. Patient does not have menstrual periods because she is on Implanon. Her mother gave her a hydrocodone but this did not provide relief. Her mother is concerned about her having a kidney infection. Her boyfriend has tried popping her back, but this made her pain worse. Patient denies dysuria, urinary frequency, and weakness.  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 She is planning to quit soon.  Review of Systems  Genitourinary: Negative for dysuria and frequency.  Musculoskeletal: Positive for back pain.  Neurological: Positive for numbness. Negative for weakness.       Objective:   Physical Exam CONSTITUTIONAL: Well developed/well nourished HEAD: Normocephalic/atraumatic EYES: EOM/PERRL ENMT: Mucous membranes moist NECK: supple no meningeal signs SPINE: Tenderness over para-lumbar and para-thoracic spinal muscles. CV: S1/S2 noted, no murmurs/rubs/gallops noted LUNGS: Lungs are clear to auscultation bilaterally, no apparent distress ABDOMEN: soft, nontender, no rebound or guarding GU: no cva tenderness NEURO: Pt is awake/alert, moves all extremitiesx4 EXTREMITIES: pulses normal, full ROM SKIN: warm, color normal PSYCH: no abnormalities of mood noted Results for orders placed or performed in visit  on 08/18/15  POCT urinalysis dipstick  Result Value Ref Range   Color, UA yellow yellow   Clarity, UA clear clear   Glucose, UA negative negative   Bilirubin, UA negative negative   Ketones, POC UA >= (160) (A) negative   Spec Grav, UA 1.020    Blood, UA negative negative   pH, UA 6.0    Protein Ur, POC negative negative   Urobilinogen, UA 0.2    Nitrite, UA Negative Negative   Leukocytes, UA Negative Negative       Assessment & Plan:   This chart was scribed in my presence and reviewed by me personally.    ICD-9-CM ICD-10-CM   1. Bilateral low back pain without sciatica 724.2 M54.5 POCT urinalysis dipstick     POCT Microscopic Urinalysis ([facility])     meloxicam (MOBIC) 7.5 MG tablet  2. Bilateral thoracic back pain 724.1 M54.6 meloxicam (MOBIC) 7.5 MG tablet   Since the onset began at the same time she started her job, it's most likely a muscular problem and his began. I suggested patient find a different job from stocking at [employer].  Signed, Elvina Sidle, MD

## 2018-05-04 ENCOUNTER — Ambulatory Visit: Payer: Self-pay | Admitting: Family Medicine

## 2019-01-26 ENCOUNTER — Other Ambulatory Visit: Payer: Self-pay

## 2019-01-26 ENCOUNTER — Emergency Department (HOSPITAL_COMMUNITY): Payer: BLUE CROSS/BLUE SHIELD

## 2019-01-26 ENCOUNTER — Encounter (HOSPITAL_COMMUNITY): Payer: Self-pay | Admitting: Emergency Medicine

## 2019-01-26 ENCOUNTER — Emergency Department (HOSPITAL_COMMUNITY)
Admission: EM | Admit: 2019-01-26 | Discharge: 2019-01-26 | Disposition: A | Discharge status: Home / Self Care | Payer: BLUE CROSS/BLUE SHIELD | Attending: Emergency Medicine | Admitting: Emergency Medicine

## 2019-01-26 DIAGNOSIS — Z79899 Other long term (current) drug therapy: Secondary | ICD-10-CM | POA: Insufficient documentation

## 2019-01-26 DIAGNOSIS — R0789 Other chest pain: Secondary | ICD-10-CM | POA: Insufficient documentation

## 2019-01-26 DIAGNOSIS — R0602 Shortness of breath: Secondary | ICD-10-CM | POA: Insufficient documentation

## 2019-01-26 DIAGNOSIS — J45909 Unspecified asthma, uncomplicated: Secondary | ICD-10-CM | POA: Insufficient documentation

## 2019-01-26 DIAGNOSIS — X500XXA Overexertion from strenuous movement or load, initial encounter: Secondary | ICD-10-CM | POA: Insufficient documentation

## 2019-01-26 LAB — I-STAT BETA HCG BLOOD, ED (MC, WL, AP ONLY): I-stat hCG, quantitative: <5 m[IU]/mL (ref <5)

## 2019-01-26 LAB — D-DIMER, QUANTITATIVE: D-Dimer, Quant: 0.35 ug/mL-FEU (ref 0.00–0.50)

## 2019-01-26 MED ORDER — METHOCARBAMOL 500 MG PO TABS: 500.0000 mg | ORAL_TABLET | Freq: Three times a day (TID) | ORAL | 0 refills | Status: AC | PRN

## 2019-01-26 NOTE — ED Provider Notes (Signed)
[place] DEPT Provider Note   CSN: [id number] Arrival date & time: 01/26/19  0900     History   Chief Complaint Chief Complaint  Patient presents with  . rib cage pain    HPI Krista Bartlett is a 23 y.o. female.     HPI Patient presents with left chest pain over the last weeks.  Comes and goes.  Worse with movements.  Worse with breathing.  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  States she does however feel short of breath.  States she has a history of asthma.  States that she is on birth control implant.  Also smokes marijuana.  No fevers.  No hemoptysis.  Does not think she is pregnant but not sure. History reviewed. No pertinent past medical history.  There are no active problems to display for this patient.   History reviewed. No pertinent surgical history.   OB History   No obstetric history on file.      Home Medications    Prior to Admission medications   Medication Sig Start Date End Date Taking? Authorizing Provider  albuterol (PROVENTIL HFA;VENTOLIN HFA) 108 (90 BASE) MCG/ACT inhaler Inhale 2 puffs into the lungs every 2 (two) hours as needed for wheezing or shortness of breath (cough). 07/31/14  Yes Cartner, Marland Kitchen, PA-C  etonogestrel (IMPLANON) 68 MG IMPL implant Inject 1 each into the skin once. Reported on 08/18/2015 08/02/13  Yes [provider]  Prenatal Vit-Fe Fumarate-FA (PRENATAL MULTIVITAMIN) TABS tablet Take 1 tablet by mouth daily at 12 noon.   Yes [provider]  meloxicam (MOBIC) 7.5 MG tablet Take 1 tablet (7.5 mg total) by mouth daily. Patient not taking: Reported on 01/26/2019 08/18/15   Robyn Haber, MD  methocarbamol (ROBAXIN) 500 MG tablet Take 1 tablet (500 mg total) by mouth every 8 (eight) hours as needed for muscle spasms. 01/26/19   Davonna Belling, MD    Family History No family history on file.  Social History Social History   Tobacco Use  .  Smoking status: Never Smoker  Substance Use Topics  . Alcohol use: Yes    Comment: every other day  . Drug use: Yes    Frequency: 7.0 times per week    Types: Marijuana     Allergies   Other and Penicillins   Review of Systems Review of Systems  Constitutional: Negative for appetite change.  HENT: Negative for congestion.   Respiratory: Positive for shortness of breath.   Cardiovascular: Positive for chest pain. Negative for leg swelling.  Gastrointestinal: Negative for abdominal pain.  Genitourinary: Negative for flank pain.  Musculoskeletal: Negative for back pain.  Skin: Negative for rash.  Neurological: Negative for weakness.  Psychiatric/Behavioral: Negative for confusion.     Physical Exam Updated Vital Signs BP 120/74   Pulse 64   Temp 98.1 F (36.7 C) (Oral)   Resp 16   LMP 11/11/2018   SpO2 100%   Physical Exam Vitals signs and nursing note reviewed.  HENT:     Head: Atraumatic.  Neck:     Musculoskeletal: Neck supple.  Cardiovascular:     Rate and Rhythm: Regular rhythm.  Pulmonary:     Comments: Tenderness left lateral chest wall.  No rash.  No crepitance or deformity. Chest:     Chest wall: Tenderness present.  Abdominal:     Tenderness: There is no abdominal tenderness.  Musculoskeletal:     Right lower leg:  No edema.     Left lower leg: No edema.  Skin:    General: Skin is warm.     Capillary Refill: Capillary refill takes less than 2 seconds.  Neurological:     Mental Status: She is alert.      ED Treatments / Results  Labs (all labs ordered are listed, but only abnormal results are displayed) Labs Reviewed  D-DIMER, QUANTITATIVE (NOT AT [facility])  I-STAT BETA HCG BLOOD, ED ([facility], [facility], [facility] ONLY)    EKG None  Radiology Dg Chest 2 View  Result Date: 01/26/2019 CLINICAL DATA:  Chest pain. Additional history: Left lateral rib cage pain for approximately 2 months. EXAM: CHEST - 2 VIEW COMPARISON:  Chest radiograph 10/29/2014 FINDINGS:  Heart size within normal limits. No focal consolidation within the lungs. No evidence of pleural effusion or pneumothorax. No acute bony abnormality. IMPRESSION: No evidence of active cardiopulmonary disease. Electronically Signed   By: Jackey LogeKyle  Golden   On: 01/26/2019 11:31    Procedures Procedures (including critical care time)  Medications Ordered in ED Medications - No data to display   Initial Impression / Assessment and Plan / ED Course  I have reviewed the triage vital signs and the nursing notes.  Pertinent labs & imaging results that were available during my care of the patient were reviewed by me and considered in my medical decision making (see chart for details).        Patient with left chest pain.  Reproducible.  Tender on left chest.  Likely musculoskeletal.  X-ray reassuring.  Doubt cardiac cause.  Negative d-dimer.  Discharge home with symptomatic treatment.  Final Clinical Impressions(s) / ED Diagnoses   Final diagnoses:  Chest wall pain    ED Discharge Orders         Ordered    methocarbamol (ROBAXIN) 500 MG tablet  Every 8 hours PRN     01/26/19 1139           Benjiman CorePickering, Dot Splinter, MD 01/26/19 1143

## 2019-01-26 NOTE — ED Triage Notes (Signed)
Pt reports that she having left side rib cage pains. 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 Pain is worse with movement or deep breathing.

## 2019-03-05 ENCOUNTER — Other Ambulatory Visit: Payer: Self-pay | Admitting: Emergency Medicine

## 2019-03-05 ENCOUNTER — Ambulatory Visit (HOSPITAL_COMMUNITY)
Admission: EM | Admit: 2019-03-05 | Discharge: 2019-03-05 | Disposition: A | Discharge status: Home / Self Care | Payer: BLUE CROSS/BLUE SHIELD | Attending: Urgent Care | Admitting: Urgent Care

## 2019-03-05 ENCOUNTER — Encounter (HOSPITAL_COMMUNITY): Payer: Self-pay

## 2019-03-05 ENCOUNTER — Other Ambulatory Visit: Payer: Self-pay

## 2019-03-05 DIAGNOSIS — Z202 Contact with and (suspected) exposure to infections with a predominantly sexual mode of transmission: Secondary | ICD-10-CM

## 2019-03-05 DIAGNOSIS — N898 Other specified noninflammatory disorders of vagina: Secondary | ICD-10-CM

## 2019-03-05 DIAGNOSIS — R300 Dysuria: Secondary | ICD-10-CM

## 2019-03-05 LAB — POCT URINALYSIS DIP (DEVICE)
Bilirubin Urine: NEGATIVE
Glucose, UA: NEGATIVE mg/dL
Hgb urine dipstick: NEGATIVE
Ketones, ur: NEGATIVE mg/dL
Nitrite: NEGATIVE
Protein, ur: NEGATIVE mg/dL
Specific Gravity, Urine: 1.015 (ref 1.005–1.030)
Urobilinogen, UA: 0.2 mg/dL (ref 0.0–1.0)
pH: 7.5 (ref 5.0–8.0)

## 2019-03-05 MED ORDER — METRONIDAZOLE 500 MG PO TABS: 500.0000 mg | ORAL_TABLET | Freq: Two times a day (BID) | ORAL | 0 refills | Status: DC

## 2019-03-05 MED ORDER — AZITHROMYCIN 250 MG PO TABS
ORAL_TABLET | ORAL | Status: AC
  Filled 2019-03-05: qty 4

## 2019-03-05 MED ORDER — AZITHROMYCIN 250 MG PO TABS
1000.0000 mg | ORAL_TABLET | Freq: Once | ORAL | Status: AC
  Administered 2019-03-05: 17:00:00 1000 mg via ORAL

## 2019-03-05 MED ORDER — LIDOCAINE HCL (PF) 1 % IJ SOLN
INTRAMUSCULAR | Status: AC
  Filled 2019-03-05: qty 2

## 2019-03-05 MED ORDER — CEFTRIAXONE SODIUM 250 MG IJ SOLR
250.0000 mg | Freq: Once | INTRAMUSCULAR | Status: AC
  Administered 2019-03-05: 250 mg via INTRAMUSCULAR

## 2019-03-05 MED ORDER — CEFTRIAXONE SODIUM 250 MG IJ SOLR
INTRAMUSCULAR | Status: AC
  Filled 2019-03-05: qty 250

## 2019-03-05 NOTE — ED Notes (Signed)
Pt was checked on by Vanetta Shawl, Rad Tech and she stated she was doing ok.

## 2019-03-05 NOTE — ED Notes (Signed)
Pt did not get d/c paperwork at time of d/c.  Pt was called and given d/c instructions by phone.  She was told to refrain from sexual activity for the next 7 days, to pick up her Rx for Flagyl at the [facility] on [address], and that we will call her if any of her tests come back positive.  Pt stated understanding.

## 2019-03-05 NOTE — ED Triage Notes (Signed)
Pt present urinary frequency with a burning sensation when she urinate. Pt did try a AZO today for relief, so her urine is orange.

## 2019-03-05 NOTE — ED Provider Notes (Signed)
[facility]    CSN: [id number] Arrival date & time: 03/05/19  1542      History   Chief Complaint Chief Complaint  Patient presents with  . Urinary Frequency    HPI Krista Bartlett is a 23 y.o. female.   Patient presents with dysuria and yellow vaginal discharge x3 days.  She has treated with Azo at home.  She is sexually active and does not use condoms.  She denies fever, chills, abdominal pain, back pain, pelvic pain, or other symptoms.  LMP: Implant.  The history is provided by the patient.    History reviewed. No pertinent past medical history.  There are no active problems to display for this patient.   History reviewed. No pertinent surgical history.  OB History   No obstetric history on file.      Home Medications    Prior to Admission medications   Medication Sig Start Date End Date Taking? Authorizing Provider  albuterol (PROVENTIL HFA;VENTOLIN HFA) 108 (90 BASE) MCG/ACT inhaler Inhale 2 puffs into the lungs every 2 (two) hours as needed for wheezing or shortness of breath (cough). 07/31/14   Comer Locket, PA-C  etonogestrel (IMPLANON) 68 MG IMPL implant Inject 1 each into the skin once. Reported on 08/18/2015 08/02/13   [provider]  meloxicam (MOBIC) 7.5 MG tablet Take 1 tablet (7.5 mg total) by mouth daily. Patient not taking: Reported on 01/26/2019 08/18/15   Robyn Haber, MD  methocarbamol (ROBAXIN) 500 MG tablet Take 1 tablet (500 mg total) by mouth every 8 (eight) hours as needed for muscle spasms. 01/26/19   Davonna Belling, MD  metroNIDAZOLE (FLAGYL) 500 MG tablet Take 1 tablet (500 mg total) by mouth 2 (two) times daily. 03/05/19   Sharion Balloon, NP  Prenatal Vit-Fe Fumarate-FA (PRENATAL MULTIVITAMIN) TABS tablet Take 1 tablet by mouth daily at 12 noon.    [provider]    Family History History reviewed. No pertinent family history.  Social History Social History   Tobacco Use  . Smoking status: Never  Smoker  . Smokeless tobacco: Never Used  Substance Use Topics  . Alcohol use: Yes    Comment: every other day  . Drug use: Yes    Frequency: 7.0 times per week    Types: Marijuana     Allergies   Other and Penicillins   Review of Systems Review of Systems  Constitutional: Negative for chills and fever.  HENT: Negative for ear pain and sore throat.   Eyes: Negative for pain and visual disturbance.  Respiratory: Negative for cough and shortness of breath.   Cardiovascular: Negative for chest pain and palpitations.  Gastrointestinal: Negative for abdominal pain and vomiting.  Genitourinary: Positive for dysuria and vaginal discharge. Negative for flank pain, hematuria and pelvic pain.  Musculoskeletal: Negative for arthralgias and back pain.  Skin: Negative for color change and rash.  Neurological: Negative for seizures and syncope.  All other systems reviewed and are negative.    Physical Exam Triage Vital Signs ED Triage Vitals  Enc Vitals Group     BP 03/05/19 1610 123/85     Pulse Rate 03/05/19 1610 66     Resp 03/05/19 1610 16     Temp 03/05/19 1610 97.9 F (36.6 C)     Temp Source 03/05/19 1610 Oral     SpO2 03/05/19 1610 99 %     Weight --      Height --      Head Circumference --  Peak Flow --      Pain Score 03/05/19 1611 8     Pain Loc --      Pain Edu? --      Excl. in GC? --    No data found.  Updated Vital Signs BP 123/85 (BP Location: Left Arm)   Pulse 66   Temp 97.9 F (36.6 C) (Oral)   Resp 16   SpO2 99%   Visual Acuity Right Eye Distance:   Left Eye Distance:   Bilateral Distance:    Right Eye Near:   Left Eye Near:    Bilateral Near:     Physical Exam Vitals signs and nursing note reviewed.  Constitutional:      General: She is not in acute distress.    Appearance: She is well-developed.  HENT:     Head: Normocephalic and atraumatic.     Mouth/Throat:     Mouth: Mucous membranes are moist.     Pharynx: Oropharynx is  clear.  Eyes:     Conjunctiva/sclera: Conjunctivae normal.  Neck:     Musculoskeletal: Neck supple.  Cardiovascular:     Rate and Rhythm: Normal rate and regular rhythm.     Heart sounds: No murmur.  Pulmonary:     Effort: Pulmonary effort is normal. No respiratory distress.     Breath sounds: Normal breath sounds.  Abdominal:     General: Bowel sounds are normal.     Palpations: Abdomen is soft.     Tenderness: There is no abdominal tenderness. There is no right CVA tenderness, left CVA tenderness, guarding or rebound.  Skin:    General: Skin is warm and dry.  Neurological:     General: No focal deficit present.     Mental Status: She is alert and oriented to person, place, and time.      UC Treatments / Results  Labs (all labs ordered are listed, but only abnormal results are displayed) Labs Reviewed  POCT URINALYSIS DIP (DEVICE) - Abnormal; Notable for the following components:      Result Value   Leukocytes,Ua TRACE (*)    All other components within normal limits  URINE CULTURE  CERVICOVAGINAL ANCILLARY ONLY    EKG   Radiology No results found.  Procedures Procedures (including critical care time)  Medications Ordered in UC Medications  azithromycin (ZITHROMAX) tablet 1,000 mg (1,000 mg Oral Given 03/05/19 1716)  cefTRIAXone (ROCEPHIN) injection 250 mg (250 mg Intramuscular Given 03/05/19 1716)  azithromycin (ZITHROMAX) 250 MG tablet (has no administration in time range)  cefTRIAXone (ROCEPHIN) 250 MG injection (has no administration in time range)  lidocaine (PF) (XYLOCAINE) 1 % injection (has no administration in time range)    Initial Impression / Assessment and Plan / UC Course  I have reviewed the triage vital signs and the nursing notes.  Pertinent labs & imaging results that were available during my care of the patient were reviewed by me and considered in my medical decision making (see chart for details).   Vaginal discharge, possible STD,  dysuria.  Urine dip: trace LE.  Urine culture pending.  Vaginal swab for STDs obtained.  Reading with Rocephin, Zithromax, metronidazole.  Instructed patient to refrain from sex until her STD tests are back.  Patient declines HIV or RPR today.  Discussed with patient that we will call her if her test results are positive and that she and her partner may require treatment at that time.  Patient agrees to plan of care.  Final Clinical Impressions(s) / UC Diagnoses   Final diagnoses:  Vaginal discharge  Potential exposure to STD  Dysuria     Discharge Instructions     You were treated with two antibiotics today, Rocephin and Zithromax.  Additionally, you are prescribed metronidazole; take this twice a day for 7 days.    Do not have sex for 7 days.  Your STD tests are pending.  If your test results are positive, we will call you.  You may need additional treatment and your partner may also need treatment.         ED Prescriptions    Medication Sig Dispense Auth. Provider   metroNIDAZOLE (FLAGYL) 500 MG tablet Take 1 tablet (500 mg total) by mouth 2 (two) times daily. 14 tablet Mickie Bailate, Avneet Ashmore H, NP     PDMP not reviewed this encounter.   Mickie Bailate, Hao Dion H, NP 03/05/19 1745

## 2019-03-05 NOTE — ED Notes (Signed)
Pt assessed by Barkley Boards, NP and stated she was ok to be discharged.  Pt was A&O  Upon d/c.

## 2019-03-05 NOTE — ED Notes (Signed)
Checked on pt.  Pt states she is doing ok, but is feeling a bit nauseous.  Pt given crackers.

## 2019-03-05 NOTE — Discharge Instructions (Addendum)
You were treated with two antibiotics today, Rocephin and Zithromax.  Additionally, you are prescribed metronidazole; take this twice a day for 7 days.   ° °Do not have sex for 7 days. ° °Your STD tests are pending.  If your test results are positive, we will call you.  You may need additional treatment and your partner may also need treatment.     ° °

## 2019-03-05 NOTE — ED Notes (Signed)
Pt reports an allergy to Penicillins, reaction of hives all over her body.  She denies swelling of the throat or tongue, or any respiratory issues.  Pt is going to stay in room 10 for observation after a Rocephin injection.  The door has been left open and she has been instructed to let us know if she starts developing any issues.

## 2019-03-06 ENCOUNTER — Telehealth (HOSPITAL_COMMUNITY): Payer: Self-pay | Admitting: Emergency Medicine

## 2019-03-06 LAB — CERVICOVAGINAL ANCILLARY ONLY
Bacterial Vaginitis (gardnerella): NEGATIVE
Candida Glabrata: NEGATIVE
Candida Vaginitis: POSITIVE — AB
Molecular Disclaimer: NEGATIVE
Molecular Disclaimer: NEGATIVE
Molecular Disclaimer: NEGATIVE
Molecular Disclaimer: NORMAL
Trichomonas: NEGATIVE

## 2019-03-06 MED ORDER — FLUCONAZOLE 150 MG PO TABS: 150.0000 mg | ORAL_TABLET | Freq: Once | ORAL | 0 refills | Status: AC

## 2019-03-06 NOTE — Telephone Encounter (Signed)
Test for candida (yeast) was positive.  Prescription for fluconazole 150mg  po now, repeat dose in 3d if needed, #2 no refills, sent to the pharmacy of record.  Recheck or followup with PCP for further evaluation if symptoms are not improving.    Pending G/C  Patient contacted and made aware of    results, all questions answered\

## 2019-03-07 LAB — URINE CULTURE: Culture: >=100000 — AB

## 2019-03-07 LAB — CERVICOVAGINAL ANCILLARY ONLY
Chlamydia: NEGATIVE
Neisseria Gonorrhea: NEGATIVE

## 2019-03-09 ENCOUNTER — Telehealth (HOSPITAL_COMMUNITY): Payer: Self-pay | Admitting: Emergency Medicine

## 2019-03-09 NOTE — Telephone Encounter (Signed)
Pt denies any symptoms at this time, reviewed with traci, no need for treatment at this time, will notify us if she develops any suymptoms.

## 2019-04-25 ENCOUNTER — Encounter (HOSPITAL_COMMUNITY): Payer: Self-pay

## 2019-04-25 ENCOUNTER — Ambulatory Visit (HOSPITAL_COMMUNITY)
Admission: EM | Admit: 2019-04-25 | Discharge: 2019-04-25 | Disposition: A | Discharge status: Home / Self Care | Payer: Self-pay | Attending: Urgent Care | Admitting: Urgent Care

## 2019-04-25 DIAGNOSIS — N760 Acute vaginitis: Secondary | ICD-10-CM | POA: Insufficient documentation

## 2019-04-25 DIAGNOSIS — N898 Other specified noninflammatory disorders of vagina: Secondary | ICD-10-CM | POA: Insufficient documentation

## 2019-04-25 MED ORDER — FLUCONAZOLE 150 MG PO TABS: 150.0000 mg | ORAL_TABLET | ORAL | 0 refills | Status: DC

## 2019-04-25 NOTE — ED Triage Notes (Signed)
Pt states having white vaginal discharge and vaginal itching x 2 days.

## 2019-04-25 NOTE — ED Provider Notes (Signed)
[place]   MRN: [id number] DOB: 07/23/1995  Subjective:   Krista Bartlett is a 23 y.o. female presenting for 2-day history of recurrent vaginal discharge and itching with redness.  Patient has a history of recurrent yeast infections.  Has also had BV in the past.  States that this is pretty consistent with her history of yeast infections.  She has never been evaluated by a gynecologist for this.  She does not have one currently.  Denies concern for STI does not prefer to get treated empirically.  No current facility-administered medications for this encounter.   Current Outpatient Medications:  .  albuterol (PROVENTIL HFA;VENTOLIN HFA) 108 (90 BASE) MCG/ACT inhaler, Inhale 2 puffs into the lungs every 2 (two) hours as needed for wheezing or shortness of breath (cough)., Disp: 1 Inhaler, Rfl: 0 .  etonogestrel (IMPLANON) 68 MG IMPL implant, Inject 1 each into the skin once. Reported on 08/18/2015, Disp: , Rfl:  .  meloxicam (MOBIC) 7.5 MG tablet, Take 1 tablet (7.5 mg total) by mouth daily. (Patient not taking: Reported on 01/26/2019), Disp: 30 tablet, Rfl: 0 .  methocarbamol (ROBAXIN) 500 MG tablet, Take 1 tablet (500 mg total) by mouth every 8 (eight) hours as needed for muscle spasms., Disp: 10 tablet, Rfl: 0 .  metroNIDAZOLE (FLAGYL) 500 MG tablet, Take 1 tablet (500 mg total) by mouth 2 (two) times daily., Disp: 14 tablet, Rfl: 0 .  Prenatal Vit-Fe Fumarate-FA (PRENATAL MULTIVITAMIN) TABS tablet, Take 1 tablet by mouth daily at 12 noon., Disp: , Rfl:    Allergies  Allergen Reactions  . Other Anaphylaxis    MRSA med  . Sulfa Antibiotics Anaphylaxis  . Sulfamethoxazole-Trimethoprim Swelling    Throat starts to close  . Penicillins Rash    History reviewed. No pertinent past medical history.   History reviewed. No pertinent surgical history.  History reviewed. No pertinent family history.  Social History   Tobacco Use  . Smoking status: Never Smoker  . Smokeless  tobacco: Never Used  Substance Use Topics  . Alcohol use: Yes    Comment: every other day  . Drug use: Yes    Frequency: 7.0 times per week    Types: Marijuana    ROS Denies fever, dysuria, urinary frequency, urinary urgency, pelvic pain, genital rash.  Objective:   Vitals: BP 112/62 (BP Location: Right Arm)   Pulse 69   Temp 98.2 F (36.8 C) (Oral)   Resp 16   LMP  (Within Months) Comment: 2 1/2 month aprox per pt  SpO2 100%   Physical Exam Constitutional:      General: She is not in acute distress.    Appearance: Normal appearance. She is well-developed. She is not ill-appearing.  HENT:     Head: Normocephalic and atraumatic.     Nose: Nose normal.     Mouth/Throat:     Mouth: Mucous membranes are moist.     Pharynx: Oropharynx is clear.  Eyes:     General: No scleral icterus.    Extraocular Movements: Extraocular movements intact.     Pupils: Pupils are equal, round, and reactive to light.  Cardiovascular:     Rate and Rhythm: Normal rate.  Pulmonary:     Effort: Pulmonary effort is normal.  Skin:    General: Skin is warm and dry.  Neurological:     General: No focal deficit present.     Mental Status: She is alert and oriented to person, place, and time.  Psychiatric:  Mood and Affect: Mood normal.        Behavior: Behavior normal.      Assessment and Plan :   1. Acute vaginitis   2. Vaginal itching   3. Vaginal discharge     Will cover for yeast vaginitis with Diflucan.  Labs pending. Counseled patient on potential for adverse effects with medications prescribed/recommended today, ER and return-to-clinic precautions discussed, patient verbalized understanding.    Wallis Bamberg, PA-C 04/25/19 1709

## 2019-04-28 LAB — CERVICOVAGINAL ANCILLARY ONLY
Bacterial vaginitis: NEGATIVE
Candida vaginitis: POSITIVE — AB
Chlamydia: NEGATIVE
Neisseria Gonorrhea: NEGATIVE
Trichomonas: NEGATIVE

## 2019-07-07 ENCOUNTER — Ambulatory Visit
Admission: EM | Admit: 2019-07-07 | Discharge: 2019-07-07 | Disposition: A | Discharge status: Home / Self Care | Payer: Self-pay

## 2019-07-07 ENCOUNTER — Other Ambulatory Visit: Payer: Self-pay

## 2019-07-07 DIAGNOSIS — N898 Other specified noninflammatory disorders of vagina: Secondary | ICD-10-CM | POA: Insufficient documentation

## 2019-07-07 LAB — POCT URINALYSIS DIP (MANUAL ENTRY)
Bilirubin, UA: NEGATIVE
Blood, UA: NEGATIVE
Glucose, UA: NEGATIVE mg/dL
Ketones, POC UA: NEGATIVE mg/dL
Leukocytes, UA: NEGATIVE
Nitrite, UA: NEGATIVE
Protein Ur, POC: NEGATIVE mg/dL
Spec Grav, UA: 1.015 (ref 1.010–1.025)
Urobilinogen, UA: 0.2 E.U./dL
pH, UA: 7 (ref 5.0–8.0)

## 2019-07-07 MED ORDER — FLUCONAZOLE 150 MG PO TABS: 150.0000 mg | ORAL_TABLET | Freq: Every day | ORAL | 0 refills | Status: AC

## 2019-07-07 NOTE — ED Provider Notes (Signed)
Renaldo Fiddler    CSN: [id number] Arrival date & time: 07/07/19  1431      History   Chief Complaint Chief Complaint  Patient presents with  . Vaginal Itching  . Urinary Urgency    HPI Krista Bartlett is a 24 y.o. female.   Patient presents with thick white vaginal discharge and vaginal itching x1 week.  She states this is similar to her previous episodes of a yeast infection.  She also reports urinary frequency and urgency x1 day.  She denies fever, chills, abdominal pain, dysuria, back pain, pelvic pain, rash, lesions, or other symptoms.  No treatments attempted at home.  Patient is sexually active and does not consistently use condoms.    The history is provided by the patient.    History reviewed. No pertinent past medical history.  There are no problems to display for this patient.   Past Surgical History:  Procedure Laterality Date  . NO PAST SURGERIES      OB History   No obstetric history on file.      Home Medications    Prior to Admission medications   Medication Sig Start Date End Date Taking? Authorizing Provider  amphetamine-dextroamphetamine (ADDERALL) 20 MG tablet Take 20 mg by mouth daily.   Yes [provider]  etonogestrel (IMPLANON) 68 MG IMPL implant Inject 1 each into the skin once. Reported on 08/18/2015 08/02/13  Yes [provider]  albuterol (PROVENTIL HFA;VENTOLIN HFA) 108 (90 BASE) MCG/ACT inhaler Inhale 2 puffs into the lungs every 2 (two) hours as needed for wheezing or shortness of breath (cough). 07/31/14   Cartner, Sharlet [place], PA-C  fluconazole (DIFLUCAN) 150 MG tablet Take 1 tablet (150 mg total) by mouth daily. Take one tablet today.  May repeat in 3 days. 07/07/19   Mickie Bail, NP  methocarbamol (ROBAXIN) 500 MG tablet Take 1 tablet (500 mg total) by mouth every 8 (eight) hours as needed for muscle spasms. 01/26/19   Benjiman Core, MD    Family History Family History  Problem Relation Age of Onset  .  Healthy Mother   . Healthy Father     Social History Social History   Tobacco Use  . Smoking status: Never Smoker  . Smokeless tobacco: Never Used  Substance Use Topics  . Alcohol use: Yes    Comment: every other day  . Drug use: Yes    Frequency: 7.0 times per week    Types: Marijuana     Allergies   Other, Sulfa antibiotics, Sulfamethoxazole-trimethoprim, and Penicillins   Review of Systems Review of Systems  Constitutional: Negative for chills and fever.  HENT: Negative for ear pain and sore throat.   Eyes: Negative for pain and visual disturbance.  Respiratory: Negative for cough and shortness of breath.   Cardiovascular: Negative for chest pain and palpitations.  Gastrointestinal: Negative for abdominal pain, diarrhea, nausea and vomiting.  Genitourinary: Positive for vaginal discharge. Negative for dysuria, flank pain, hematuria and pelvic pain.  Musculoskeletal: Negative for arthralgias and back pain.  Skin: Negative for color change and rash.  Neurological: Negative for seizures and syncope.  All other systems reviewed and are negative.    Physical Exam Triage Vital Signs ED Triage Vitals  Enc Vitals Group     BP      Pulse      Resp      Temp      Temp src      SpO2      Weight  Height      Head Circumference      Peak Flow      Pain Score      Pain Loc      Pain Edu?      Excl. in [place]?    No data found.  Updated Vital Signs BP (!) 107/49 (BP Location: Left Arm)   Pulse 92   Temp 98.3 F (36.8 C) (Oral)   Resp 14   Ht 5\' 2"  (1.575 m)   Wt 110 lb (49.9 kg)   SpO2 98%   BMI 20.12 kg/m   Visual Acuity Right Eye Distance:   Left Eye Distance:   Bilateral Distance:    Right Eye Near:   Left Eye Near:    Bilateral Near:     Physical Exam Vitals and nursing note reviewed.  Constitutional:      General: She is not in acute distress.    Appearance: She is well-developed. She is not ill-appearing.  HENT:     Head: Normocephalic  and atraumatic.     Mouth/Throat:     Mouth: Mucous membranes are moist.     Pharynx: Oropharynx is clear.  Eyes:     Conjunctiva/sclera: Conjunctivae normal.  Cardiovascular:     Rate and Rhythm: Normal rate and regular rhythm.     Heart sounds: No murmur.  Pulmonary:     Effort: Pulmonary effort is normal. No respiratory distress.     Breath sounds: Normal breath sounds.  Abdominal:     General: Bowel sounds are normal.     Palpations: Abdomen is soft.     Tenderness: There is no abdominal tenderness. There is no right CVA tenderness, left CVA tenderness, guarding or rebound.  Musculoskeletal:     Cervical back: Neck supple.  Skin:    General: Skin is warm and dry.     Findings: No rash.  Neurological:     General: No focal deficit present.     Mental Status: She is alert and oriented to person, place, and time.  Psychiatric:        Mood and Affect: Mood normal.        Behavior: Behavior normal.      UC Treatments / Results  Labs (all labs ordered are listed, but only abnormal results are displayed) Labs Reviewed  POCT URINALYSIS DIP (MANUAL ENTRY)  CERVICOVAGINAL ANCILLARY ONLY    EKG   Radiology No results found.  Procedures Procedures (including critical care time)  Medications Ordered in UC Medications - No data to display  Initial Impression / Assessment and Plan / UC Course  I have reviewed the triage vital signs and the nursing notes.  Pertinent labs & imaging results that were available during my care of the patient were reviewed by me and considered in my medical decision making (see chart for details).   Vaginal discharge.  Vaginal self swab obtained by patient.  Treating with Diflucan.  Instructed patient to abstain from sex for 7 days.  Discussed that her STD test are pending and we will call her if her results are positive requiring treatment.  Discussed that she and her sexual partners may require additional treatment at that time.  Patient  agrees to plan of care.      Final Clinical Impressions(s) / UC Diagnoses   Final diagnoses:  Vaginal discharge     Discharge Instructions     Take the Diflucan as directed.    Do not have sex for 7 days.  Your STD tests are pending.  If your test results are positive, we will call you.  You may need additional treatment and your partner(s) may also need treatment.           ED Prescriptions    Medication Sig Dispense Auth. Provider   fluconazole (DIFLUCAN) 150 MG tablet Take 1 tablet (150 mg total) by mouth daily. Take one tablet today.  May repeat in 3 days. 2 tablet Mickie Bail, NP     PDMP not reviewed this encounter.   Mickie Bail, NP 07/07/19 1501

## 2019-07-07 NOTE — ED Triage Notes (Signed)
Patient complains of vaginal itching x 1 week  Patient also complains of urinary urgency, frequency x yesterday.

## 2019-07-07 NOTE — Discharge Instructions (Addendum)
Take the Diflucan as directed.    Do not have sex for 7 days.  Your STD tests are pending.  If your test results are positive, we will call you.  You may need additional treatment and your partner(s) may also need treatment.

## 2019-07-11 ENCOUNTER — Telehealth (HOSPITAL_COMMUNITY): Payer: Self-pay | Admitting: Emergency Medicine

## 2019-07-11 LAB — CERVICOVAGINAL ANCILLARY ONLY
Bacterial vaginitis: POSITIVE — AB
Candida vaginitis: NEGATIVE
Chlamydia: NEGATIVE
Neisseria Gonorrhea: NEGATIVE
Trichomonas: NEGATIVE

## 2019-07-11 MED ORDER — METRONIDAZOLE 500 MG PO TABS: 500.0000 mg | ORAL_TABLET | Freq: Two times a day (BID) | ORAL | 0 refills | Status: AC

## 2019-07-11 NOTE — Telephone Encounter (Signed)
Bacterial vaginosis is positive. Pt needs treatment. Flagyl 500 mg BID x 7 days #14 no refills sent to patients pharmacy of choice.    Attempted to reach patient. No answer at this time. Voicemail left.     

## 2019-07-11 NOTE — Telephone Encounter (Signed)
Patient contacted by phone and made aware of  bv  results. Pt verbalized understanding and had all questions answered.    

## 2021-03-21 DIAGNOSIS — Z3689 Encounter for other specified antenatal screening: Secondary | ICD-10-CM | POA: Diagnosis not present

## 2021-03-21 DIAGNOSIS — Z3A28 28 weeks gestation of pregnancy: Secondary | ICD-10-CM | POA: Diagnosis not present

## 2021-05-15 DIAGNOSIS — O99713 Diseases of the skin and subcutaneous tissue complicating pregnancy, third trimester: Secondary | ICD-10-CM | POA: Diagnosis not present

## 2021-05-15 DIAGNOSIS — L292 Pruritus vulvae: Secondary | ICD-10-CM | POA: Diagnosis not present

## 2021-05-29 DIAGNOSIS — L299 Pruritus, unspecified: Secondary | ICD-10-CM | POA: Diagnosis not present

## 2021-05-29 DIAGNOSIS — O99713 Diseases of the skin and subcutaneous tissue complicating pregnancy, third trimester: Secondary | ICD-10-CM | POA: Diagnosis not present

## 2021-06-07 IMAGING — CR CHEST - 2 VIEW
2 series · 2 of 2 positions shown · non-contrast
Comparison: Chest radiograph 10/29/2014

CLINICAL DATA: Chest pain. Additional history: Left lateral rib
cage pain for approximately 2 months.

EXAM:
CHEST - 2 VIEW

[w chest pa]
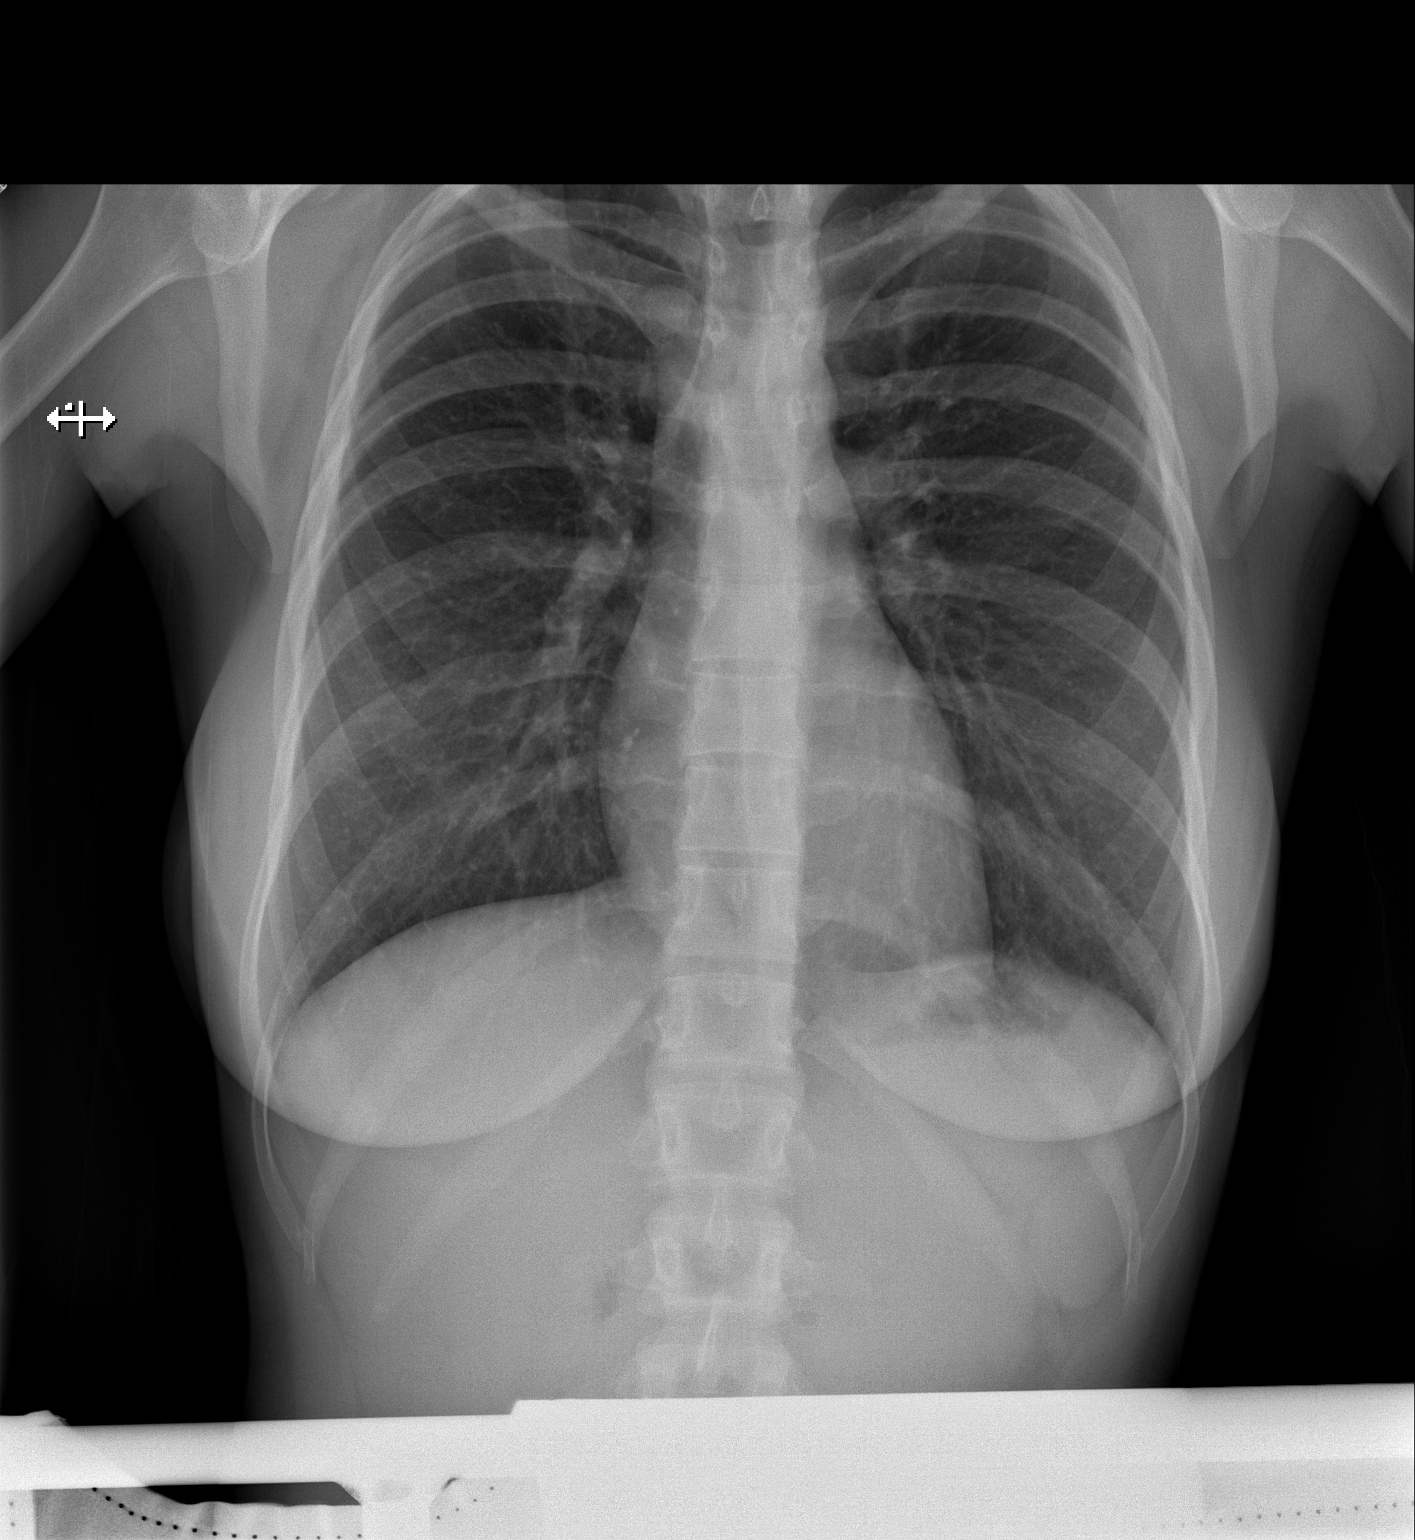

[w chest lat]
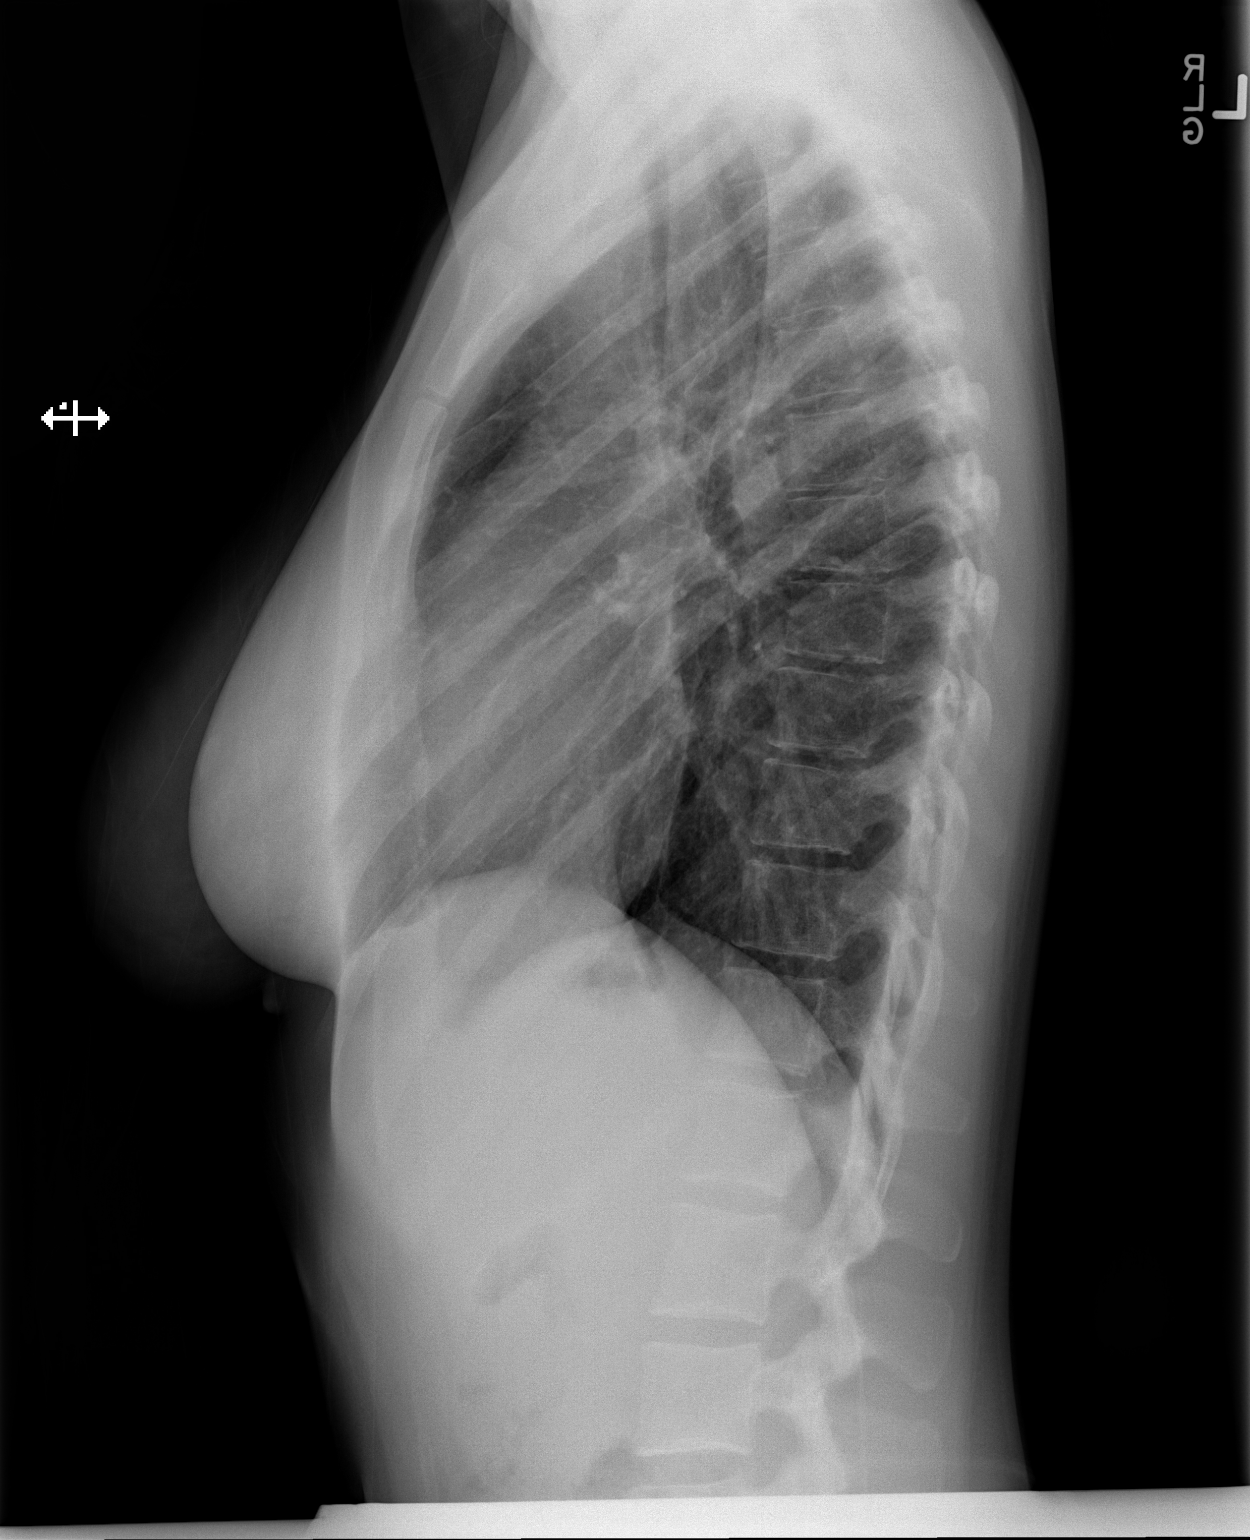

[2 of 2 positions shown; findings below may reference images not displayed]

FINDINGS: Heart size within normal limits. No focal consolidation within the
lungs. No evidence of pleural effusion or pneumothorax. No acute
bony abnormality.
IMPRESSION: No evidence of active cardiopulmonary disease.

## 2021-06-08 DIAGNOSIS — O43893 Other placental disorders, third trimester: Secondary | ICD-10-CM | POA: Diagnosis not present

## 2021-06-08 DIAGNOSIS — Z3A39 39 weeks gestation of pregnancy: Secondary | ICD-10-CM | POA: Diagnosis not present

## 2021-06-08 DIAGNOSIS — K831 Obstruction of bile duct: Secondary | ICD-10-CM | POA: Diagnosis not present

## 2021-06-08 DIAGNOSIS — O2662 Liver and biliary tract disorders in childbirth: Secondary | ICD-10-CM | POA: Diagnosis not present

## 2021-06-09 DIAGNOSIS — O9903 Anemia complicating the puerperium: Secondary | ICD-10-CM | POA: Diagnosis not present

## 2021-06-09 DIAGNOSIS — D62 Acute posthemorrhagic anemia: Secondary | ICD-10-CM | POA: Diagnosis not present

## 2021-06-10 DIAGNOSIS — Z3A39 39 weeks gestation of pregnancy: Secondary | ICD-10-CM | POA: Diagnosis not present

## 2021-06-10 DIAGNOSIS — O411230 Chorioamnionitis, third trimester, not applicable or unspecified: Secondary | ICD-10-CM | POA: Diagnosis not present

## 2021-07-10 DIAGNOSIS — Z30011 Encounter for initial prescription of contraceptive pills: Secondary | ICD-10-CM | POA: Diagnosis not present

## 2021-07-10 DIAGNOSIS — F53 Postpartum depression: Secondary | ICD-10-CM | POA: Diagnosis not present

## 2021-07-10 DIAGNOSIS — O99345 Other mental disorders complicating the puerperium: Secondary | ICD-10-CM | POA: Diagnosis not present
